# Patient Record
Sex: Female | Born: 1985 | Race: White | Hispanic: No | Marital: Single | Smoking: Never smoker
Health system: Southern US, Community
[De-identification: ages and names within clinical notes are randomized; demographics above are authoritative.]

## PROBLEM LIST (undated history)

## (undated) DIAGNOSIS — I10 Essential (primary) hypertension: Secondary | ICD-10-CM

## (undated) DIAGNOSIS — E7800 Pure hypercholesterolemia, unspecified: Secondary | ICD-10-CM

## (undated) DIAGNOSIS — E119 Type 2 diabetes mellitus without complications: Secondary | ICD-10-CM

---

## 2006-11-24 ENCOUNTER — Emergency Department: Payer: Self-pay | Admitting: Emergency Medicine

## 2006-11-28 ENCOUNTER — Inpatient Hospital Stay: Payer: Self-pay | Admitting: General Surgery

## 2015-07-20 ENCOUNTER — Emergency Department
Admission: EM | Admit: 2015-07-20 | Discharge: 2015-07-20 | Disposition: A | Discharge status: Home / Self Care | Payer: PRIVATE HEALTH INSURANCE | Attending: Emergency Medicine | Admitting: Emergency Medicine

## 2015-07-20 ENCOUNTER — Other Ambulatory Visit: Payer: Self-pay

## 2015-07-20 ENCOUNTER — Emergency Department: Payer: PRIVATE HEALTH INSURANCE

## 2015-07-20 ENCOUNTER — Encounter: Payer: Self-pay | Admitting: Emergency Medicine

## 2015-07-20 DIAGNOSIS — I10 Essential (primary) hypertension: Secondary | ICD-10-CM | POA: Diagnosis not present

## 2015-07-20 DIAGNOSIS — R0789 Other chest pain: Secondary | ICD-10-CM | POA: Diagnosis not present

## 2015-07-20 DIAGNOSIS — Z7984 Long term (current) use of oral hypoglycemic drugs: Secondary | ICD-10-CM | POA: Insufficient documentation

## 2015-07-20 DIAGNOSIS — E7800 Pure hypercholesterolemia, unspecified: Secondary | ICD-10-CM | POA: Insufficient documentation

## 2015-07-20 DIAGNOSIS — Z79899 Other long term (current) drug therapy: Secondary | ICD-10-CM | POA: Insufficient documentation

## 2015-07-20 DIAGNOSIS — E119 Type 2 diabetes mellitus without complications: Secondary | ICD-10-CM | POA: Diagnosis not present

## 2015-07-20 DIAGNOSIS — R079 Chest pain, unspecified: Secondary | ICD-10-CM

## 2015-07-20 HISTORY — DX: Essential (primary) hypertension: I10

## 2015-07-20 HISTORY — DX: Pure hypercholesterolemia, unspecified: E78.00

## 2015-07-20 HISTORY — DX: Type 2 diabetes mellitus without complications: E11.9

## 2015-07-20 LAB — CBC
HEMATOCRIT: 41 % (ref 35.0–47.0)
HEMOGLOBIN: 13.6 g/dL (ref 12.0–16.0)
MCH: 28.8 pg (ref 26.0–34.0)
MCHC: 33.1 g/dL (ref 32.0–36.0)
MCV: 87.1 fL (ref 80.0–100.0)
Platelets: 307 10*3/uL (ref 150–440)
RBC: 4.71 MIL/uL (ref 3.80–5.20)
RDW: 13.5 % (ref 11.5–14.5)
WBC: 12.1 10*3/uL — ABNORMAL HIGH (ref 3.6–11.0)

## 2015-07-20 LAB — TROPONIN I: Troponin I: <0.03 ng/mL (ref <0.031)

## 2015-07-20 LAB — BASIC METABOLIC PANEL
ANION GAP: 7 (ref 5–15)
BUN: 14 mg/dL (ref 6–20)
CALCIUM: 8.8 mg/dL — AB (ref 8.9–10.3)
CO2: 22 mmol/L (ref 22–32)
Chloride: 107 mmol/L (ref 101–111)
Creatinine, Ser: 1.02 mg/dL — ABNORMAL HIGH (ref 0.44–1.00)
GFR calc Af Amer: >60 mL/min (ref >60)
GFR calc non Af Amer: >60 mL/min (ref >60)
GLUCOSE: 171 mg/dL — AB (ref 65–99)
Potassium: 3.9 mmol/L (ref 3.5–5.1)
Sodium: 136 mmol/L (ref 135–145)

## 2015-07-20 MED ORDER — TRAMADOL HCL 50 MG PO TABS: 50.0000 mg | ORAL_TABLET | Freq: Four times a day (QID) | ORAL | Status: AC | PRN

## 2015-07-20 MED ORDER — IOPAMIDOL (ISOVUE-370) INJECTION 76%
100.0000 mL | Freq: Once | INTRAVENOUS | Status: AC | PRN
  Administered 2015-07-20: 80 mL via INTRAVENOUS

## 2015-07-20 NOTE — ED Notes (Signed)
Reports chest pain rad to left arm x 3 days

## 2015-07-20 NOTE — ED Provider Notes (Signed)
Time Seen: Approximately *1900  I have reviewed the triage notes  Chief Complaint: Chest Pain   History of Present Illness: Samantha Gilbert is a 30 y.o. female who presents with a three-day history of left-sided chest discomfort radiating toward her left arm. She denies any exacerbating or relieving factors and states it's been relatively constant with occasional exacerbations. She denies any back or flank pain she denies any abdominal pain. She denies any nausea, vomiting, pleuritic, or positional component. Patient does state that she has a history of pulmonary embolism and is currently on Mirena. She denies any new leg pain or swelling and states she had a Doppler study done remotely that showed no evidence of clots. She was on blood thinners for a significant period of time and then had some "" internal bleeding "" and the anticoagulation therapy was stopped. The of factor V Lieden deficiency. She denies any fever or chills or productive cough Past Medical History  Diagnosis Date  . Hypertension   . Diabetes mellitus without complication (HCC)   . Hypercholesteremia     There are no active problems to display for this patient.   History reviewed. No pertinent past surgical history.  History reviewed. No pertinent past surgical history.  Current Outpatient Rx  Name  Route  Sig  Dispense  Refill  . levonorgestrel (MIRENA) 20 MCG/24HR IUD   Intrauterine   1 each by Intrauterine route once.         Marland Kitchen. lisinopril (PRINIVIL,ZESTRIL) 10 MG tablet   Oral   Take 10 mg by mouth daily.         . metFORMIN (GLUCOPHAGE-XR) 500 MG 24 hr tablet   Oral   Take 500 mg by mouth daily with breakfast.         . Omega-3 Fatty Acids (FISH OIL) 1000 MG CAPS   Oral   Take 2,000 mg by mouth every evening.         . simvastatin (ZOCOR) 10 MG tablet   Oral   Take 10 mg by mouth every evening.         . traMADol (ULTRAM) 50 MG tablet   Oral   Take 1 tablet (50 mg total) by mouth  every 6 (six) hours as needed.   20 tablet   0     Allergies:  Review of patient's allergies indicates no known allergies.  Family History: No family history on file.  Social History: Social History  Substance Use Topics  . Smoking status: Never Smoker   . Smokeless tobacco: None  . Alcohol Use: None     Review of Systems:   10 point review of systems was performed and was otherwise negative:  Constitutional: No fever Eyes: No visual disturbances ENT: No sore throat, ear pain Cardiac: Chest pain described above Respiratory: No shortness of breath, wheezing, or stridor Abdomen: No abdominal pain, no vomiting, No diarrhea Endocrine: No weight loss, No night sweats Extremities: No peripheral edema, cyanosis Skin: No rashes, easy bruising Neurologic: No focal weakness, trouble with speech or swollowing Urologic: No dysuria, Hematuria, or urinary frequency   Physical Exam:  ED Triage Vitals  Enc Vitals Group     BP 07/20/15 1804 145/89 mmHg     Pulse Rate 07/20/15 1804 100     Resp 07/20/15 1804 18     Temp 07/20/15 1804 98.7 F (37.1 C)     Temp Source 07/20/15 1804 Oral     SpO2 07/20/15 1804 96 %  Weight 07/20/15 1804 333 lb (151.048 kg)     Height 07/20/15 1804  (1.778 m)     Head Cir --      Peak Flow --      Pain Score 07/20/15 1823 2     Pain Loc --      Pain Edu? --      Excl. in GC? --     General: Awake , Alert , and Oriented times 3; GCS 15 Head: Normal cephalic , atraumatic Eyes: Pupils equal , round, reactive to light Nose/Throat: No nasal drainage, patent upper airway without erythema or exudate.  Neck: Supple, Full range of motion, No anterior adenopathy or palpable thyroid masses Lungs: Clear to ascultation without wheezes , rhonchi, or rales Heart: Regular rate, regular rhythm without murmurs , gallops , or rubs Abdomen: Soft, non tender without rebound, guarding , or rigidity; bowel sounds positive and symmetric in all 4  quadrants. No organomegaly .        Extremities: 2 plus symmetric pulses. No edema, clubbing or cyanosis Neurologic: normal ambulation, Motor symmetric without deficits, sensory intact Skin: warm, dry, no rashes   Labs:   All laboratory work was reviewed including any pertinent negatives or positives listed below:  Labs Reviewed  BASIC METABOLIC PANEL - Abnormal; Notable for the following:    Glucose, Bld 171 (*)    Creatinine, Ser 1.02 (*)    Calcium 8.8 (*)    All other components within normal limits  CBC - Abnormal; Notable for the following:    WBC 12.1 (*)    All other components within normal limits  TROPONIN I  Laboratory work was reviewed and showed no clinically significant abnormalities.   EKG:  ED ECG REPORT I, Jennye Moccasin, the attending physician, personally viewed and interpreted this ECG.  Date: 07/20/2015 EKG Time: *1808 Rate: 91 Rhythm: normal sinus rhythm QRS Axis: Left posterior fascicular block Intervals: normal ST/T Wave abnormalities: normal Conduction Disturbances: none Narrative Interpretation: unremarkable No acute ischemic changes  Radiology:    Final result by Rad Results In Interface (07/20/15 20:18:47)   Narrative:   CLINICAL DATA: Chest pain radiating to LEFT arm for 3 days, hypertension, history of blood clotting disorder and pulmonary emboli in 2008, no longer on anti coagulation, hypertension, diabetes mellitus  EXAM: CT ANGIOGRAPHY CHEST WITH CONTRAST  TECHNIQUE: Multidetector CT imaging of the chest was performed using the standard protocol during bolus administration of intravenous contrast. Multiplanar CT image reconstructions and MIPs were obtained to evaluate the vascular anatomy.  CONTRAST: 80 cc Isovue 370 IV  COMPARISON: None  FINDINGS: Degradation of image quality secondary to body habitus.  Scattered respiratory motion artifacts at lobes.  Aorta normal caliber without aneurysm or  dissection.  Pulmonary arteries adequately opacified and patent.  No evidence of pulmonary embolism.  Visualized liver and spleen normal appearance.  No thoracic adenopathy.  Minimal residual thymic tissue in anterior mediastinum.  Lungs clear.  No pulmonary infiltrate, pleural effusion or pneumothorax.  Osseous structures unremarkable.  Review of the MIP images confirms the above findings.  IMPRESSION: Negative CTA chest as above.   Electronically Signed By: Ulyses Southward M.D. On: 07/20/2015 20:18          DG Chest 2 View (Final result) Result time: 07/20/15 18:53:12   Final result by Rad Results In Interface (07/20/15 18:53:12)   Narrative:   CLINICAL DATA: Intermittent chest pain for 3 days with left arm pain  EXAM: CHEST 2 VIEW  COMPARISON: None.  FINDINGS: The heart size and mediastinal contours are within normal limits. Both lungs are clear. The visualized skeletal structures are unremarkable.  IMPRESSION: No active cardiopulmonary disease.   Electronically Signed By: Esperanza Heir M.D.        I personally reviewed the radiologic studies    ED Course: Differential includes all life-threatening causes for chest pain. This includes but is not exclusive to acute coronary syndrome, aortic dissection, pulmonary embolism, cardiac tamponade, community-acquired pneumonia, pericarditis, musculoskeletal chest wall pain, etc. Given the patient's current clinical presentation and objective findings I wasn't sure of the exact cause of her chest pain at this time. I felt was unlikely to be life threatening such as acute coronary syndrome especially based on the patient's age and the fact that she's had this pain now for the last 3 days and her cardiac enzymes are negative along with a negative EKG for ischemic changes. There is a posterior axis shift of her EKG but otherwise no ST elevation, depression, etc. The patient's chest CT was negative  for pulmonary embolism, aortic dissection, etc. the patient was given a copy of her lab work and her EKG and radiology readings to take with her. She has a physician in [place]. She denies having any local follow-up available.     Assessment:  Acute unspecified chest pain  Final Clinical Impression: *   Final diagnoses:  Chest pain  Chest pain, unspecified     Plan: Outpatient management Patient was advised to return immediately if condition worsens. Patient was advised to follow up with their primary care physician or other specialized physicians involved in their outpatient care. The patient and/or family member/power of attorney had laboratory results reviewed at the bedside. All questions and concerns were addressed and appropriate discharge instructions were distributed by the nursing staff.            Jennye Moccasin, MD 07/20/15 [phone]

## 2017-10-10 IMAGING — CR DG CHEST 2V
2 series · 2 of 2 positions shown · non-contrast
Comparison: None.

CLINICAL DATA: Intermittent chest pain for 3 days with left arm
pain

EXAM:
CHEST  2 VIEW

[chest pa]
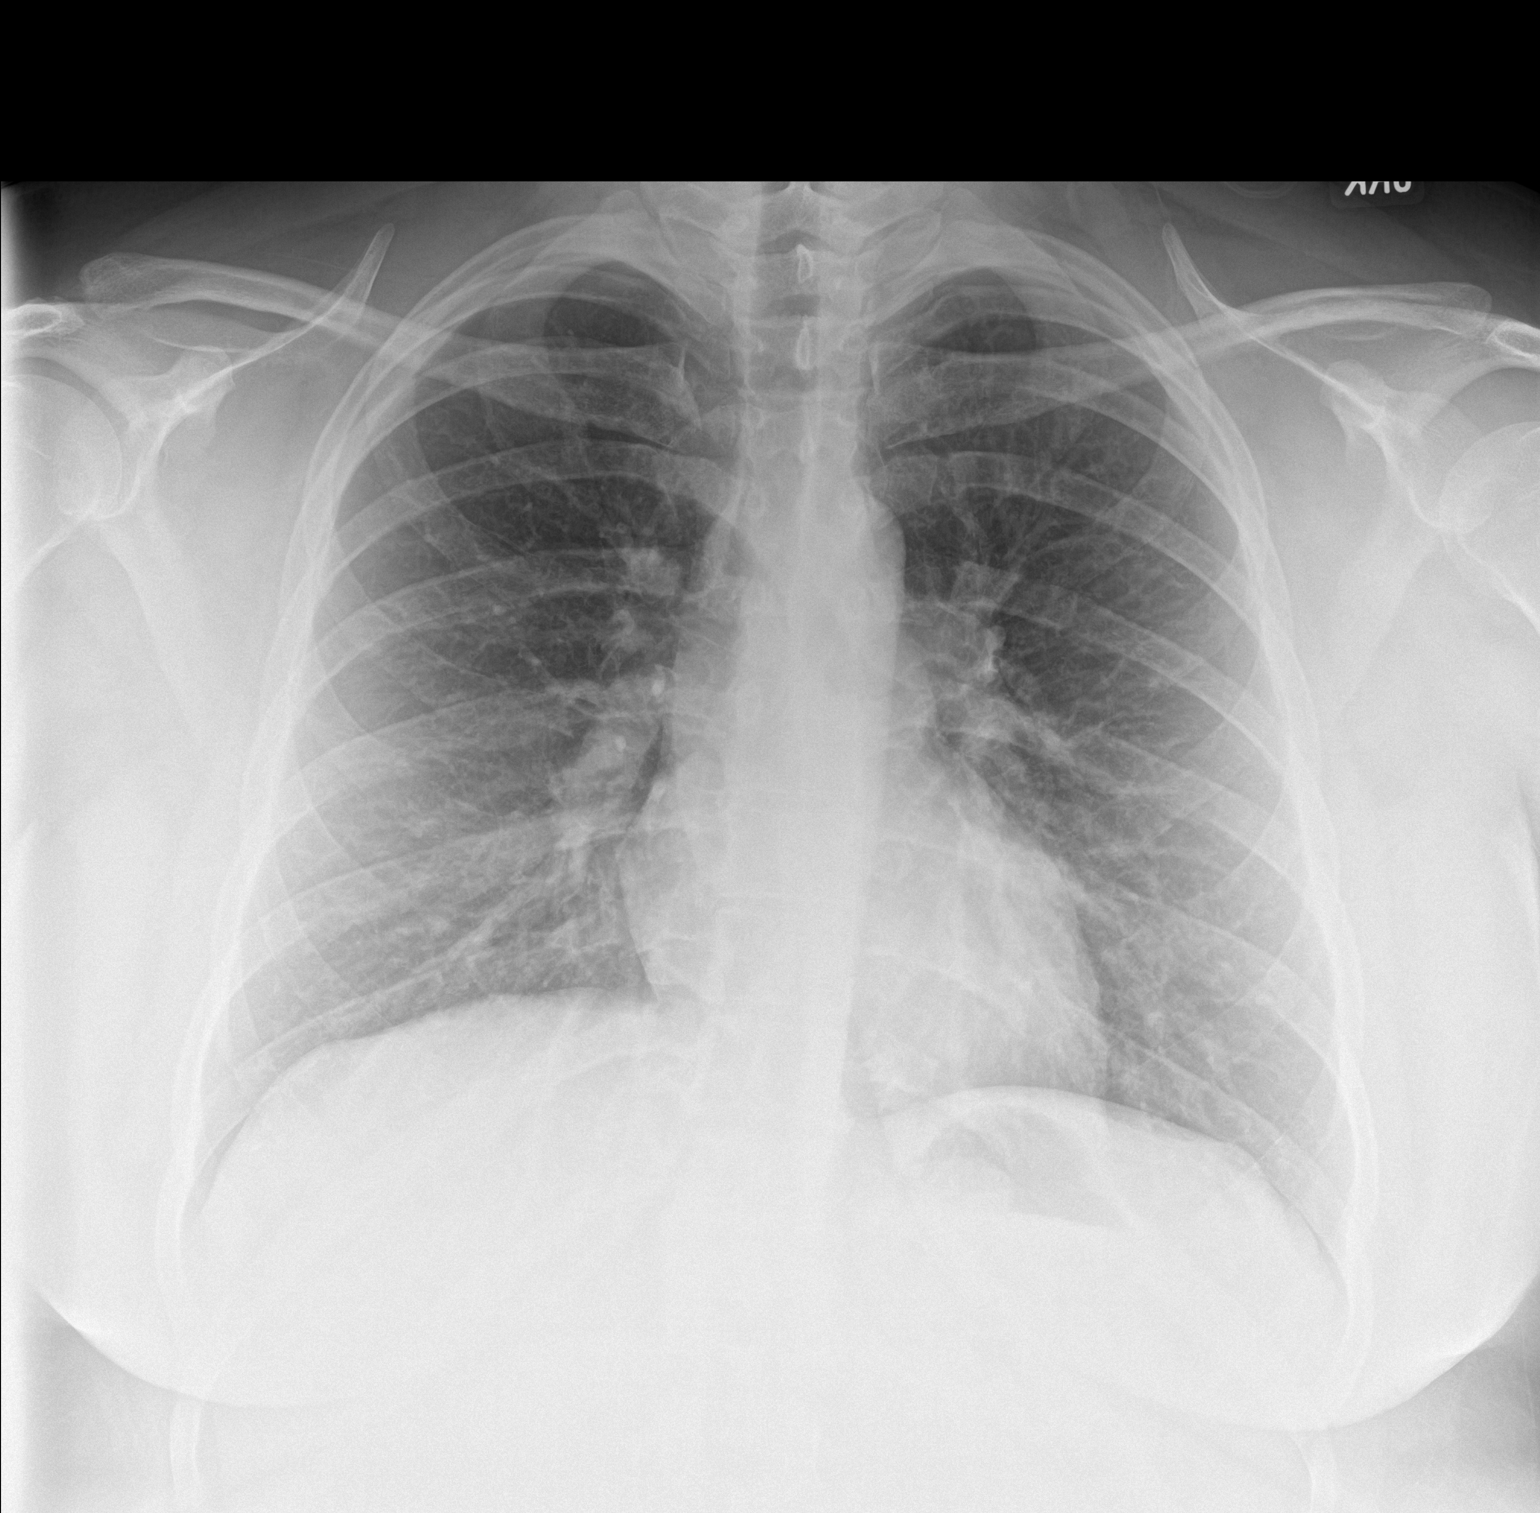

[chest lat]
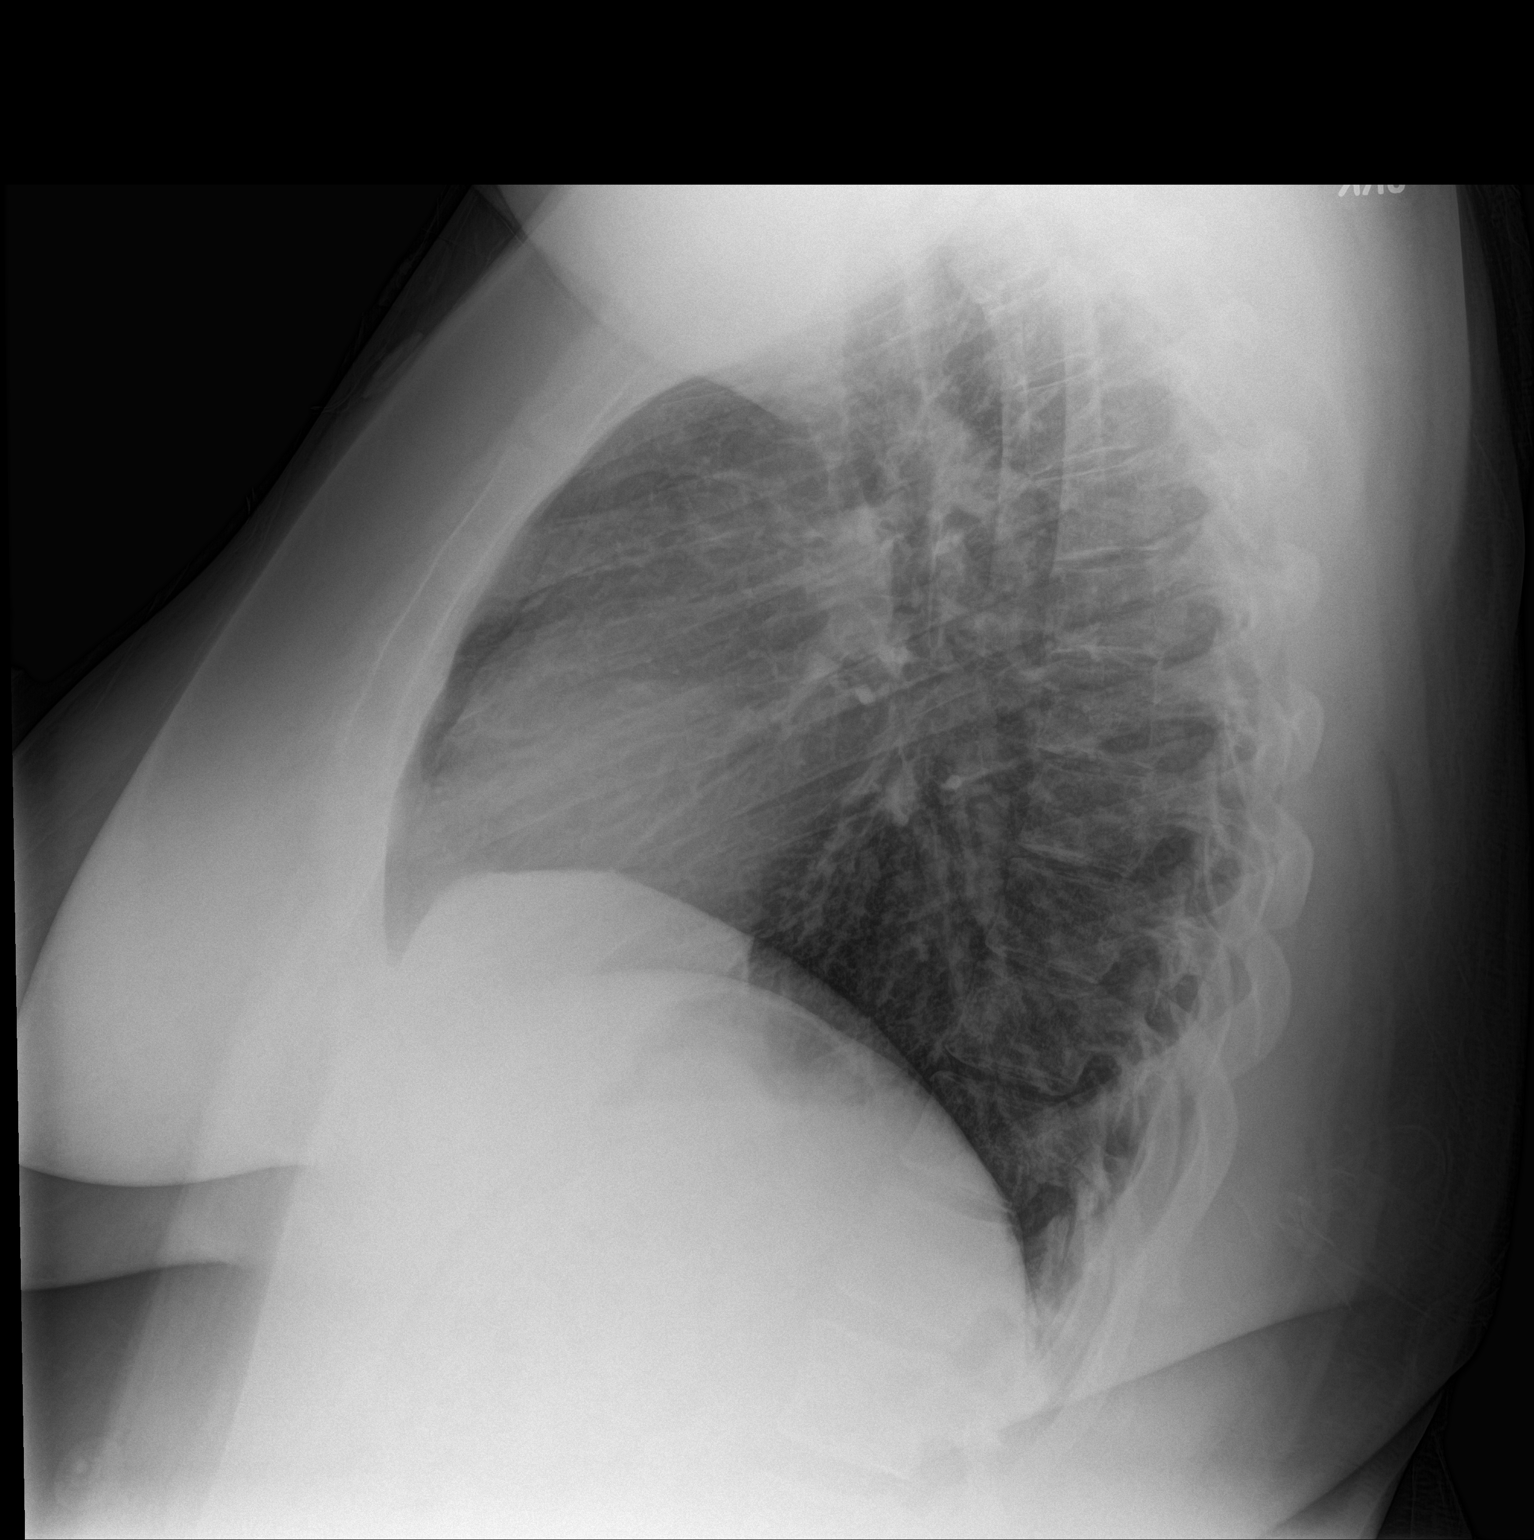

[2 of 2 positions shown; findings below may reference images not displayed]

FINDINGS: The heart size and mediastinal contours are within normal limits.
Both lungs are clear. The visualized skeletal structures are
unremarkable.
IMPRESSION: No active cardiopulmonary disease.

## 2017-10-10 IMAGING — CT CT ANGIO CHEST
1 of 2 series · 18 of 30 positions shown · IV contrast (APPLIED)
Comparison: None

CLINICAL DATA: Chest pain radiating to LEFT arm for 3 days,
hypertension, history of blood clotting disorder and pulmonary
emboli in 7332, no longer on anti coagulation, hypertension,
diabetes mellitus

EXAM:
CT ANGIOGRAPHY CHEST WITH CONTRAST
TECHNIQUE: Multidetector CT imaging of the chest was performed using the
standard protocol during bolus administration of intravenous
contrast. Multiplanar CT image reconstructions and MIPs were
obtained to evaluate the vascular anatomy.
CONTRAST:  80 cc Isovue 370 IV

[Series 5: pe 1.0 thins · axial · 0.64mm/px · z∈[-320,-148]mm · 18 of 194 slices shown]
[im 11/194  lung]
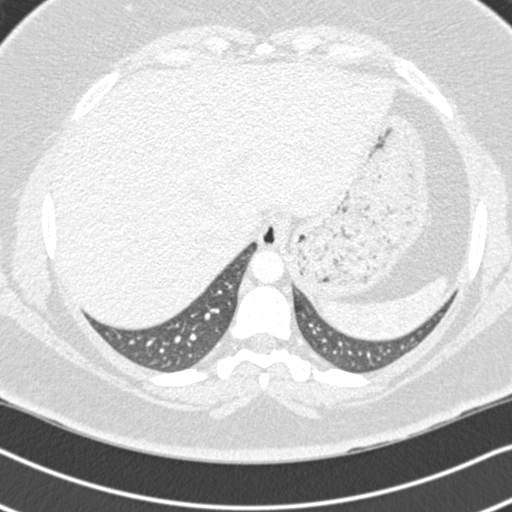
[im 22/194  mediastinal]
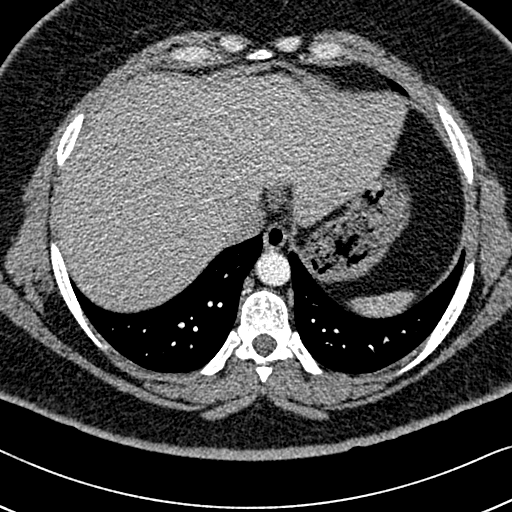
[im 33/194  lung]
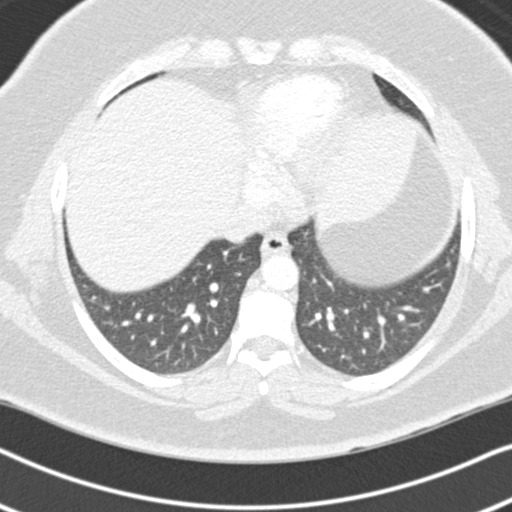
[im 43/194  mediastinal]
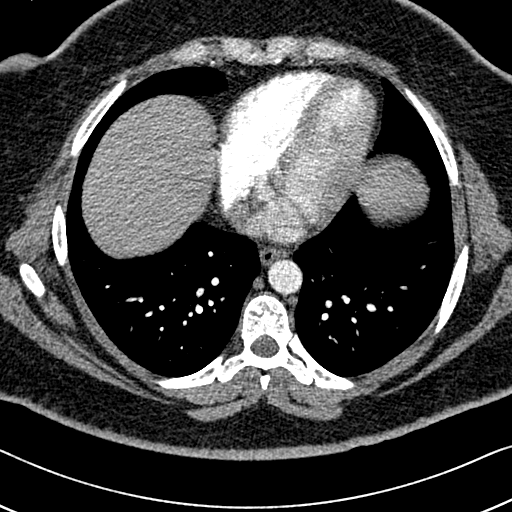
[im 54/194  lung]
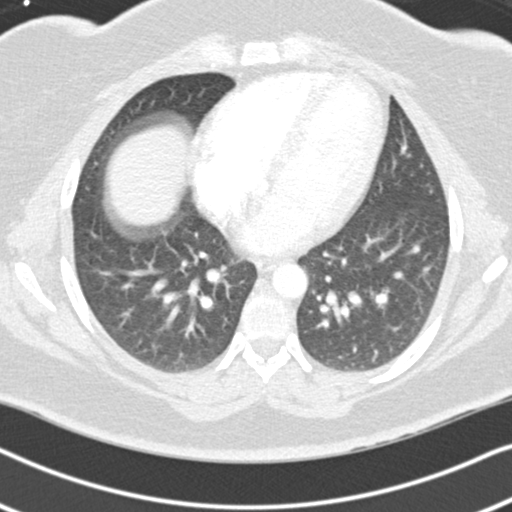
[im 65/194  mediastinal]
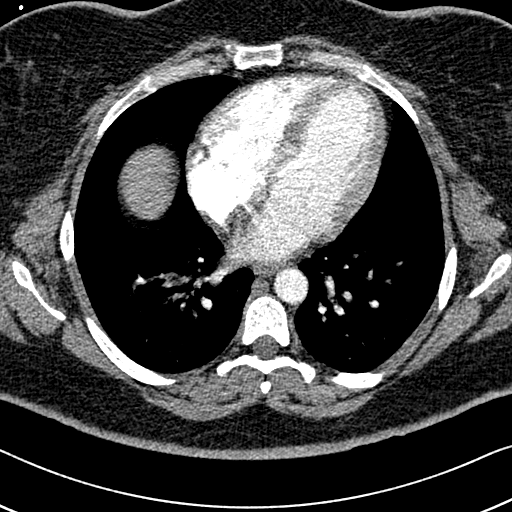
[im 76/194  lung]
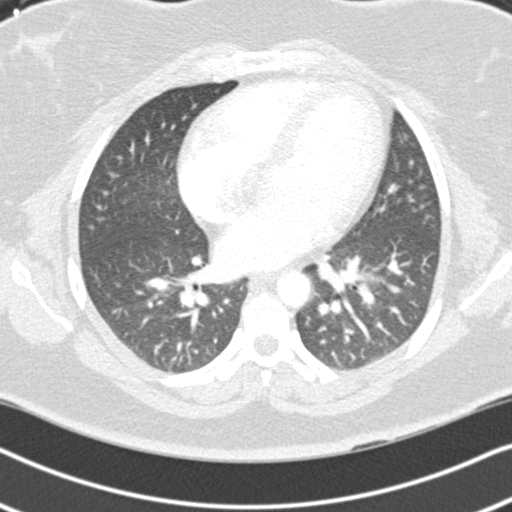
[im 86/194  mediastinal]
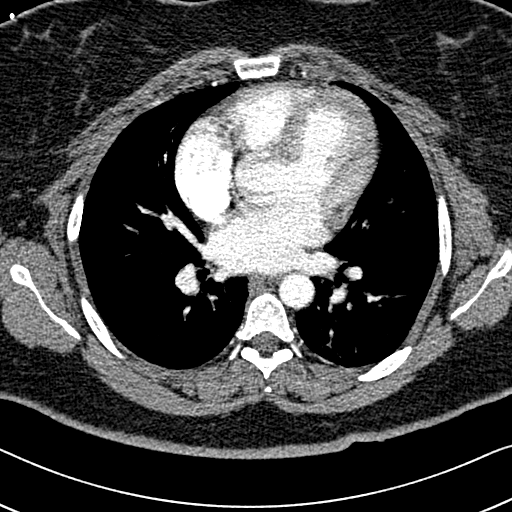
[im 88/194  lung]
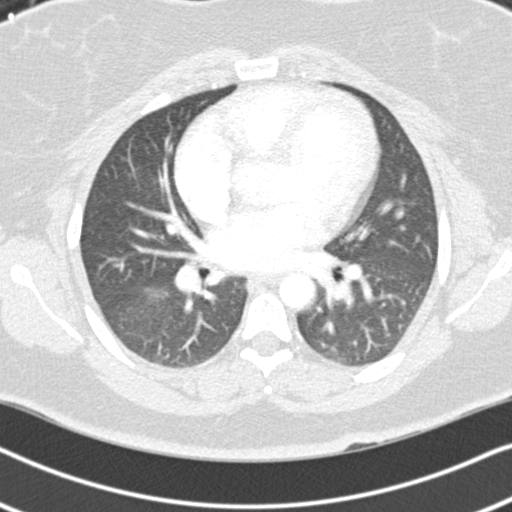
[im 97/194  mediastinal]
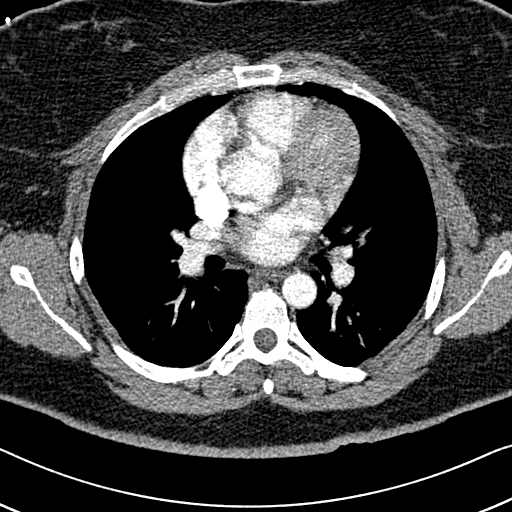
[im 108/194  lung]
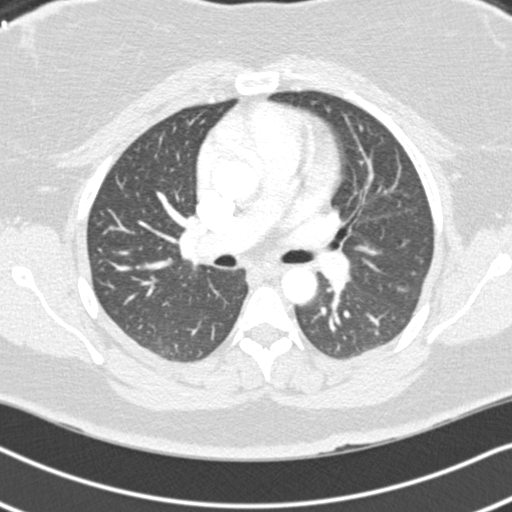
[im 118/194  mediastinal]
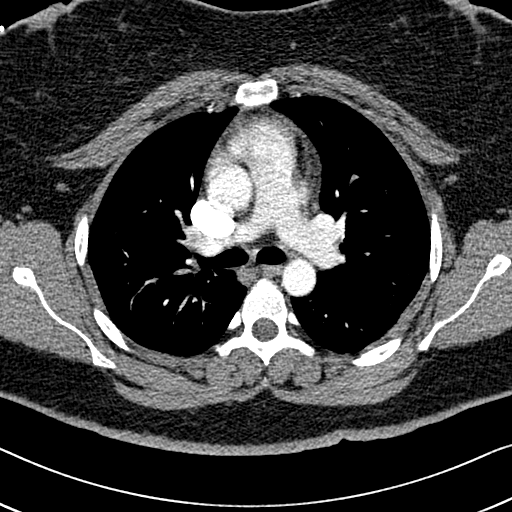
[im 129/194  lung]
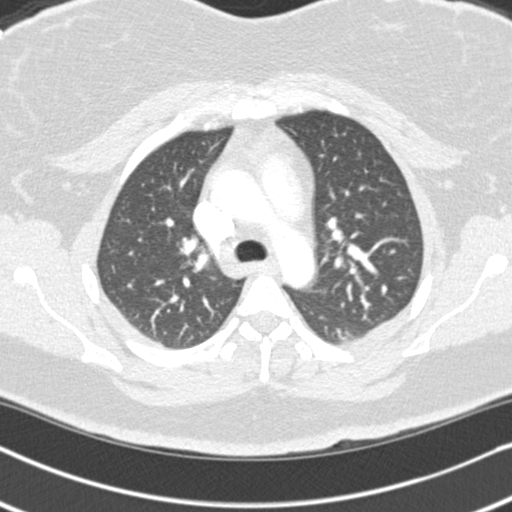
[im 140/194  mediastinal]
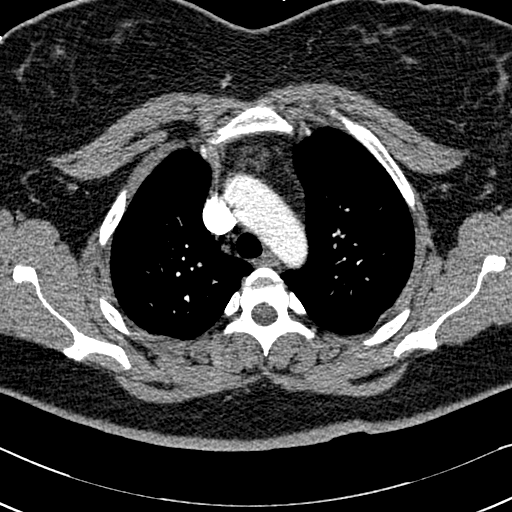
[im 151/194  lung]
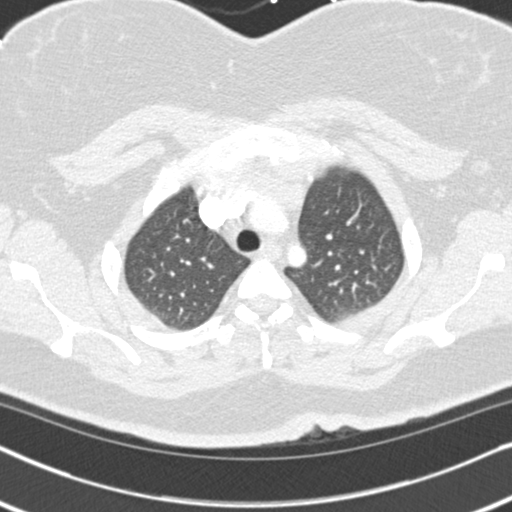
[im 161/194  mediastinal]
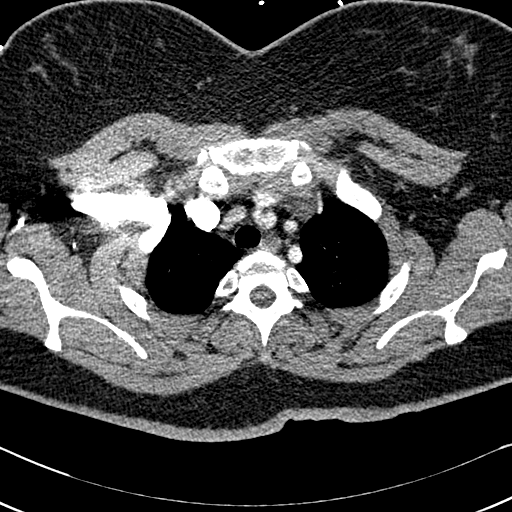
[im 172/194  lung]
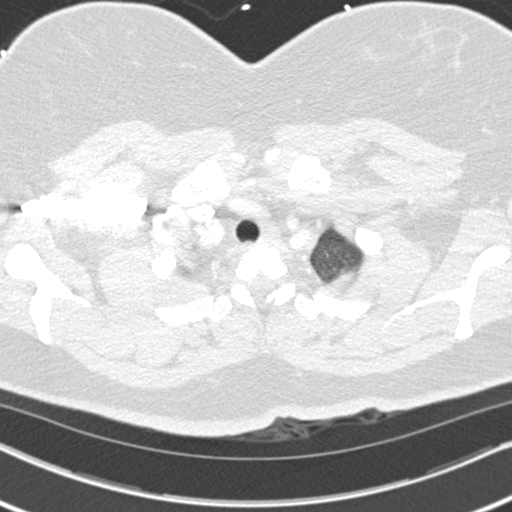
[im 183/194  mediastinal]
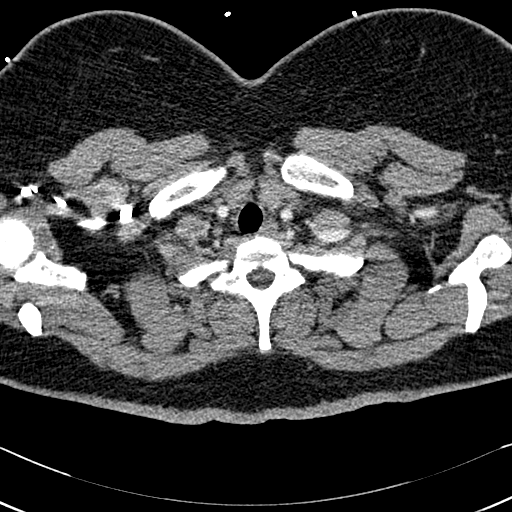

[18 of 30 positions shown; findings below may reference images not displayed]

FINDINGS: Degradation of image quality secondary to body habitus.

Scattered respiratory motion artifacts at lobes.

Aorta normal caliber without aneurysm or dissection.

Pulmonary arteries adequately opacified and patent.

No evidence of pulmonary embolism.

Visualized liver and spleen normal appearance.

No thoracic adenopathy.

Minimal residual thymic tissue in anterior mediastinum.

Lungs clear.

No pulmonary infiltrate, pleural effusion or pneumothorax.

Osseous structures unremarkable.

Review of the MIP images confirms the above findings.
IMPRESSION: Negative CTA chest as above.
